# Patient Record
Sex: Male | Born: 1965 | Race: White | Hispanic: No | Smoking: Never smoker
Health system: Southern US, Community
[De-identification: ages and names within clinical notes are randomized; demographics above are authoritative.]

## PROBLEM LIST (undated history)

## (undated) DIAGNOSIS — U071 COVID-19: Secondary | ICD-10-CM

## (undated) DIAGNOSIS — E559 Vitamin D deficiency, unspecified: Secondary | ICD-10-CM

## (undated) DIAGNOSIS — R0789 Other chest pain: Secondary | ICD-10-CM

## (undated) DIAGNOSIS — S4990XA Unspecified injury of shoulder and upper arm, unspecified arm, initial encounter: Secondary | ICD-10-CM

## (undated) DIAGNOSIS — H269 Unspecified cataract: Secondary | ICD-10-CM

## (undated) DIAGNOSIS — H3320 Serous retinal detachment, unspecified eye: Secondary | ICD-10-CM

## (undated) HISTORY — PX: OTHER SURGICAL HISTORY: SHX169

## (undated) HISTORY — DX: Unspecified cataract: H26.9

## (undated) HISTORY — DX: Unspecified injury of shoulder and upper arm, unspecified arm, initial encounter: S49.90XA

## (undated) HISTORY — DX: Other chest pain: R07.89

## (undated) HISTORY — PX: CATARACT EXTRACTION, BILATERAL: SHX1313

## (undated) HISTORY — DX: Vitamin D deficiency, unspecified: E55.9

## (undated) HISTORY — DX: COVID-19: U07.1

## (undated) HISTORY — DX: Serous retinal detachment, unspecified eye: H33.20

## (undated) HISTORY — PX: VITRECTOMY: SHX106

## (undated) HISTORY — PX: ABDOMINAL SURGERY: SHX537

---

## 2003-11-25 ENCOUNTER — Emergency Department (HOSPITAL_COMMUNITY): Admission: EM | Admit: 2003-11-25 | Discharge: 2003-11-25 | Payer: Self-pay | Admitting: Emergency Medicine

## 2005-03-29 IMAGING — CT CT ANGIO CHEST
1 of 3 series · 19 of 28 positions shown · IV contrast (omnipaque)
Comparison: none

CLINICAL DATA: Chest pain, shortness of breath.
 CHEST CT ANGIO WITH CONTRAST
TECHNIQUE: Multidetector CT imaging of the chest was performed according to the protocol for detection of pulmonary embolism during IV bolus injection of 150 ml Omnipaque 300.  Coronal and sagittal plane reformatted images were also generated.

[Series 4: chest/pe 1.0 b10f · axial · 0.74mm/px · z∈[+1723,+1911]mm · 19 of 416 slices shown]
[im 20/416  lung]
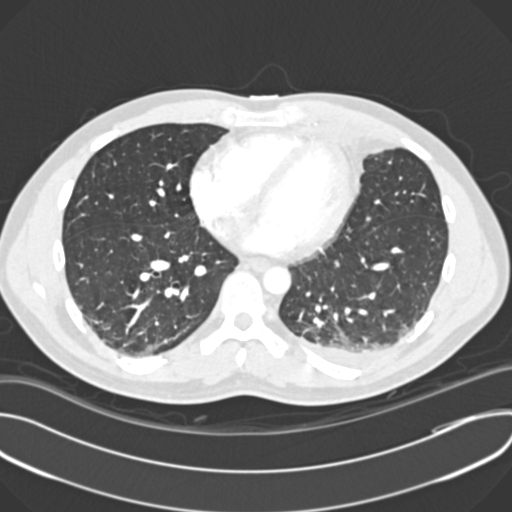
[im 40/416  mediastinal]
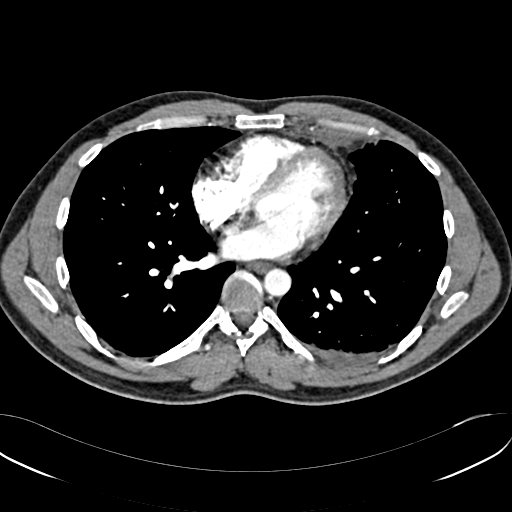
[im 60/416  lung]
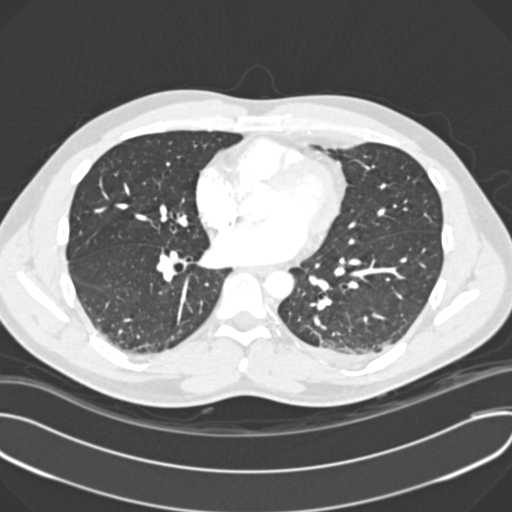
[im 80/416  mediastinal]
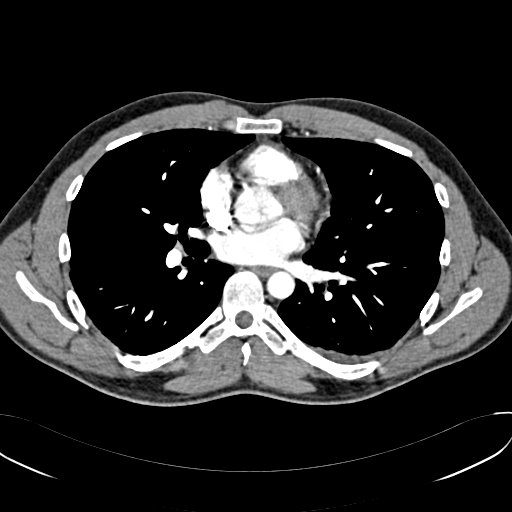
[im 99/416  lung]
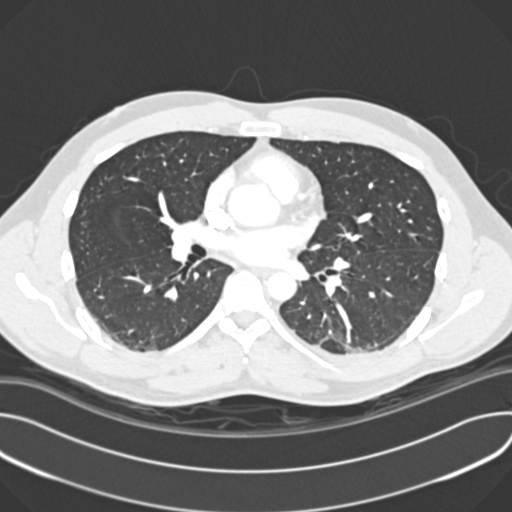
[im 119/416  mediastinal]
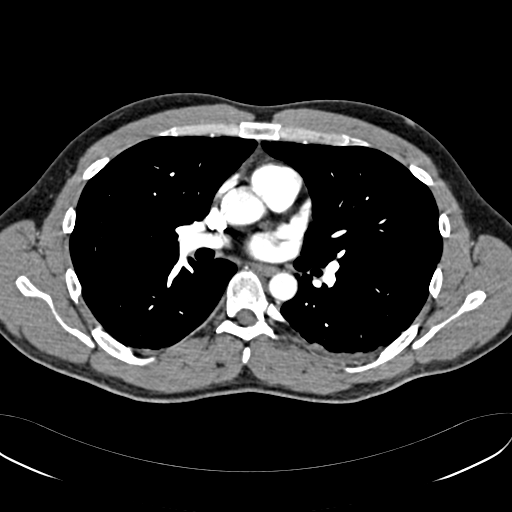
[im 139/416  lung]
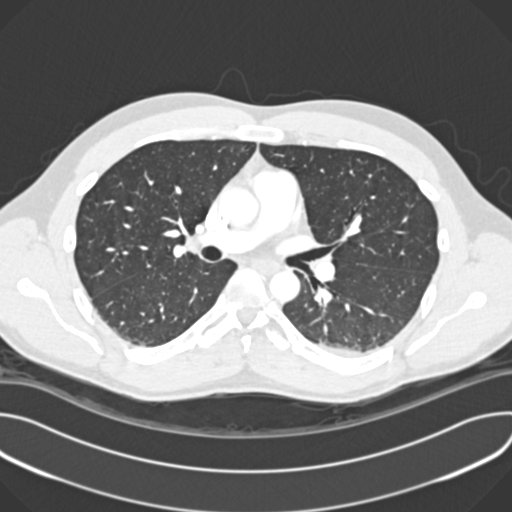
[im 159/416  mediastinal]
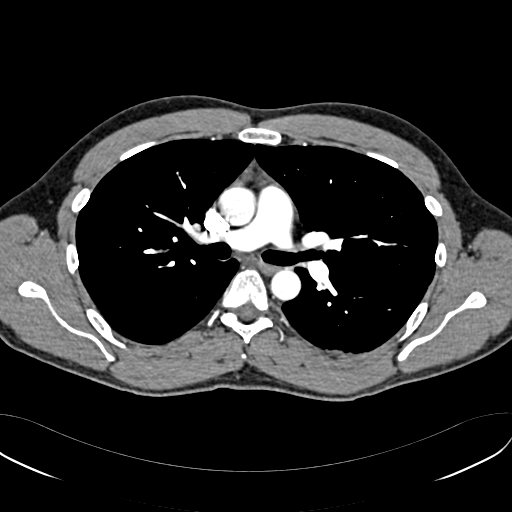
[im 178/416  lung]
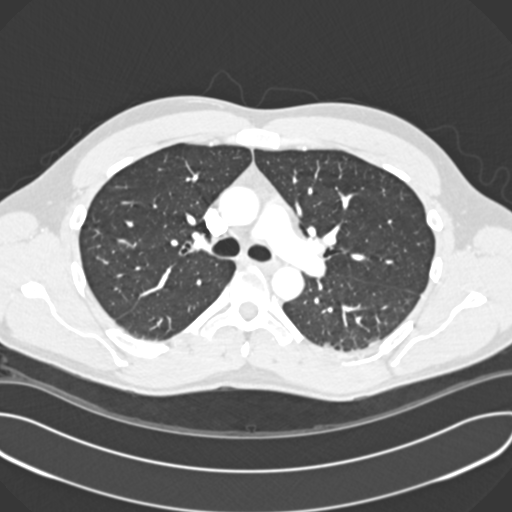
[im 218/416  mediastinal]
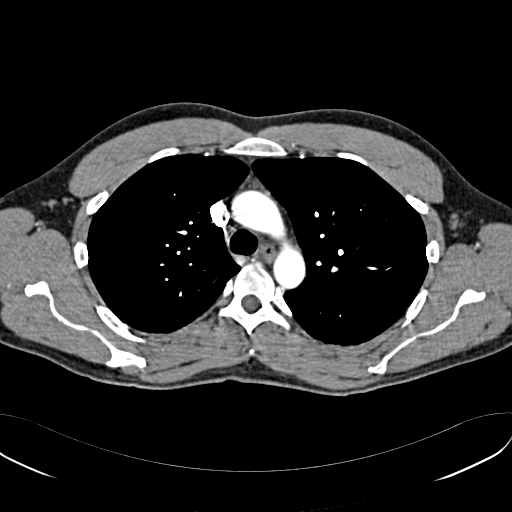
[im 238/416  lung]
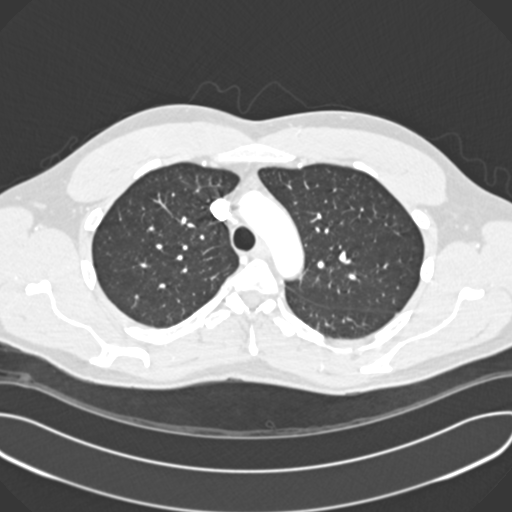
[im 257/416  mediastinal]
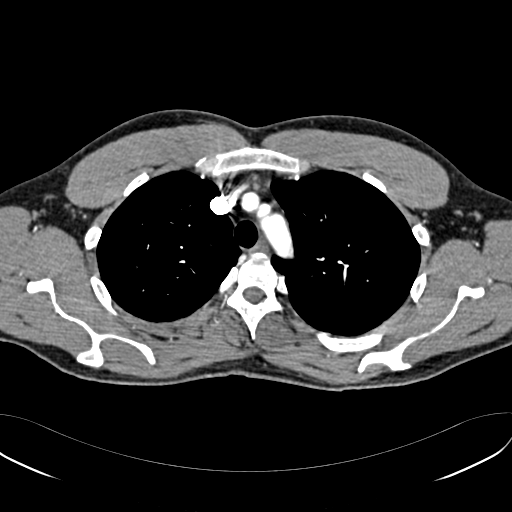
[im 277/416  lung]
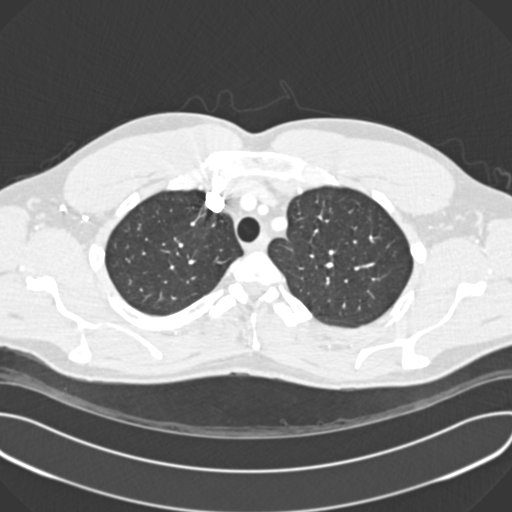
[im 297/416  mediastinal]
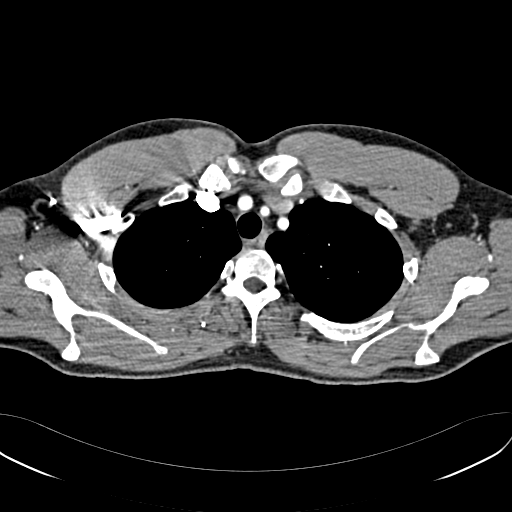
[im 317/416  lung]
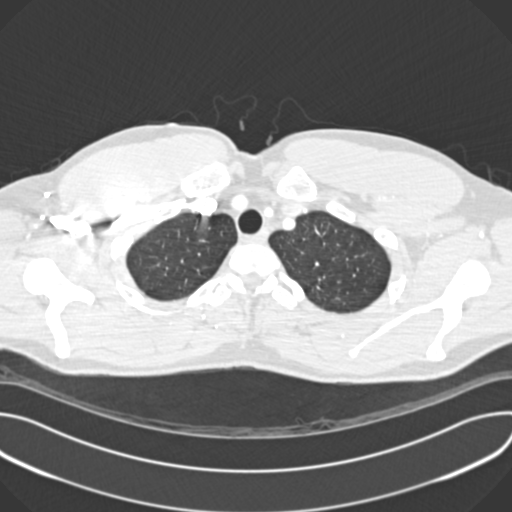
[im 336/416  mediastinal]
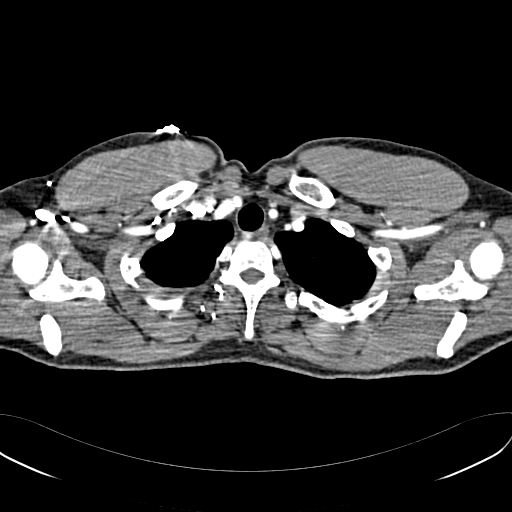
[im 356/416  lung]
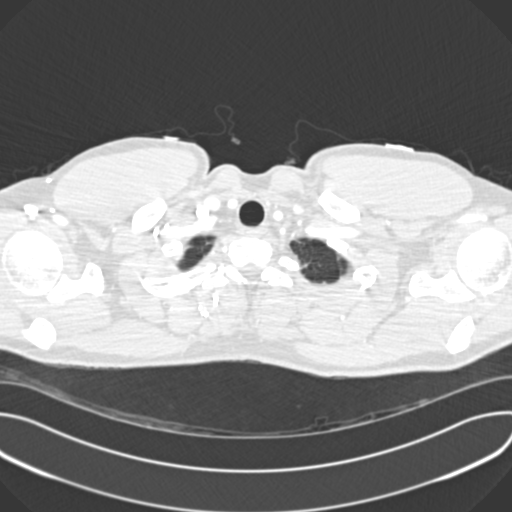
[im 376/416  mediastinal]
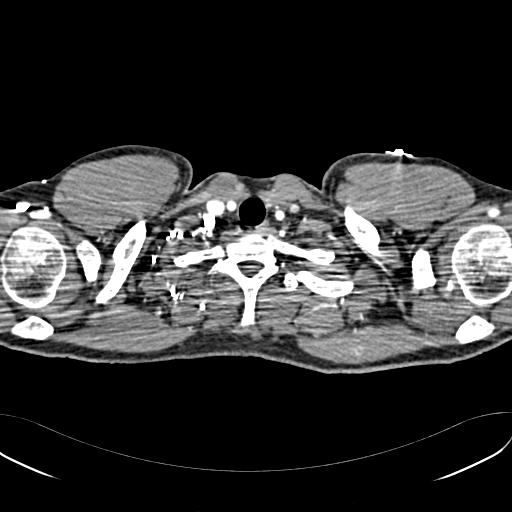
[im 396/416  lung]
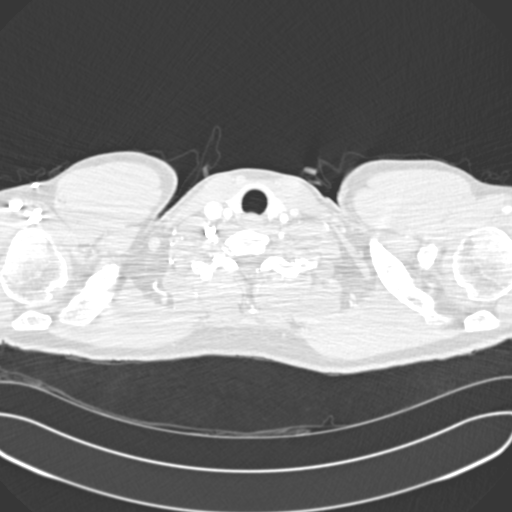

[19 of 28 positions shown; findings below may reference images not displayed]

FINDINGS: In all three planes, there are no definite filling defects in the major pulmonary arteries, or their first and second order divisions.  Despite an earlier chest x-ray showing no abnormality, there is an infiltrate at the left lung base along with a small left effusion.  The findings are compatible with pneumonia.
 No adenopathy.  No right effusion or pericardial fluid.
 IMPRESSION
 1.  No definite pulmonary emboli.
 2.  Left lower lobe air space disease with effusion.  Findings are consistent with pneumonia.

## 2015-11-07 DIAGNOSIS — R195 Other fecal abnormalities: Secondary | ICD-10-CM | POA: Diagnosis not present

## 2015-11-07 DIAGNOSIS — Z13220 Encounter for screening for lipoid disorders: Secondary | ICD-10-CM | POA: Diagnosis not present

## 2015-11-07 DIAGNOSIS — Z0000 Encounter for general adult medical examination without abnormal findings: Secondary | ICD-10-CM | POA: Diagnosis not present

## 2015-11-30 DIAGNOSIS — R197 Diarrhea, unspecified: Secondary | ICD-10-CM | POA: Diagnosis not present

## 2015-12-09 DIAGNOSIS — R197 Diarrhea, unspecified: Secondary | ICD-10-CM | POA: Diagnosis not present

## 2016-02-08 DIAGNOSIS — D1801 Hemangioma of skin and subcutaneous tissue: Secondary | ICD-10-CM | POA: Diagnosis not present

## 2016-02-08 DIAGNOSIS — L281 Prurigo nodularis: Secondary | ICD-10-CM | POA: Diagnosis not present

## 2016-02-08 DIAGNOSIS — L570 Actinic keratosis: Secondary | ICD-10-CM | POA: Diagnosis not present

## 2016-02-08 DIAGNOSIS — L720 Epidermal cyst: Secondary | ICD-10-CM | POA: Diagnosis not present

## 2016-02-08 DIAGNOSIS — Z85828 Personal history of other malignant neoplasm of skin: Secondary | ICD-10-CM | POA: Diagnosis not present

## 2016-02-08 DIAGNOSIS — L821 Other seborrheic keratosis: Secondary | ICD-10-CM | POA: Diagnosis not present

## 2016-02-13 DIAGNOSIS — R197 Diarrhea, unspecified: Secondary | ICD-10-CM | POA: Diagnosis not present

## 2016-02-13 DIAGNOSIS — D123 Benign neoplasm of transverse colon: Secondary | ICD-10-CM | POA: Diagnosis not present

## 2016-02-13 DIAGNOSIS — D126 Benign neoplasm of colon, unspecified: Secondary | ICD-10-CM | POA: Diagnosis not present

## 2016-02-13 DIAGNOSIS — K648 Other hemorrhoids: Secondary | ICD-10-CM | POA: Diagnosis not present

## 2016-02-16 DIAGNOSIS — D126 Benign neoplasm of colon, unspecified: Secondary | ICD-10-CM | POA: Diagnosis not present

## 2016-02-18 DIAGNOSIS — H524 Presbyopia: Secondary | ICD-10-CM | POA: Diagnosis not present

## 2016-11-13 DIAGNOSIS — Z0000 Encounter for general adult medical examination without abnormal findings: Secondary | ICD-10-CM | POA: Diagnosis not present

## 2017-02-12 DIAGNOSIS — D2261 Melanocytic nevi of right upper limb, including shoulder: Secondary | ICD-10-CM | POA: Diagnosis not present

## 2017-02-12 DIAGNOSIS — L570 Actinic keratosis: Secondary | ICD-10-CM | POA: Diagnosis not present

## 2017-02-12 DIAGNOSIS — D225 Melanocytic nevi of trunk: Secondary | ICD-10-CM | POA: Diagnosis not present

## 2017-02-12 DIAGNOSIS — L821 Other seborrheic keratosis: Secondary | ICD-10-CM | POA: Diagnosis not present

## 2017-05-01 DIAGNOSIS — J111 Influenza due to unidentified influenza virus with other respiratory manifestations: Secondary | ICD-10-CM | POA: Diagnosis not present

## 2017-05-01 DIAGNOSIS — R509 Fever, unspecified: Secondary | ICD-10-CM | POA: Diagnosis not present

## 2017-12-29 DIAGNOSIS — M79661 Pain in right lower leg: Secondary | ICD-10-CM | POA: Diagnosis not present

## 2017-12-29 DIAGNOSIS — Z043 Encounter for examination and observation following other accident: Secondary | ICD-10-CM | POA: Diagnosis not present

## 2017-12-29 DIAGNOSIS — I10 Essential (primary) hypertension: Secondary | ICD-10-CM | POA: Diagnosis not present

## 2017-12-29 DIAGNOSIS — S4992XA Unspecified injury of left shoulder and upper arm, initial encounter: Secondary | ICD-10-CM | POA: Diagnosis not present

## 2017-12-29 DIAGNOSIS — Z23 Encounter for immunization: Secondary | ICD-10-CM | POA: Diagnosis not present

## 2017-12-29 DIAGNOSIS — M25511 Pain in right shoulder: Secondary | ICD-10-CM | POA: Diagnosis not present

## 2017-12-29 DIAGNOSIS — Y999 Unspecified external cause status: Secondary | ICD-10-CM | POA: Diagnosis not present

## 2017-12-29 DIAGNOSIS — D72829 Elevated white blood cell count, unspecified: Secondary | ICD-10-CM | POA: Diagnosis not present

## 2017-12-29 DIAGNOSIS — S51812A Laceration without foreign body of left forearm, initial encounter: Secondary | ICD-10-CM | POA: Diagnosis not present

## 2017-12-29 DIAGNOSIS — S51802A Unspecified open wound of left forearm, initial encounter: Secondary | ICD-10-CM | POA: Diagnosis not present

## 2017-12-29 DIAGNOSIS — R52 Pain, unspecified: Secondary | ICD-10-CM | POA: Diagnosis not present

## 2017-12-29 DIAGNOSIS — S41112A Laceration without foreign body of left upper arm, initial encounter: Secondary | ICD-10-CM | POA: Diagnosis not present

## 2018-02-18 DIAGNOSIS — L821 Other seborrheic keratosis: Secondary | ICD-10-CM | POA: Diagnosis not present

## 2018-02-18 DIAGNOSIS — D225 Melanocytic nevi of trunk: Secondary | ICD-10-CM | POA: Diagnosis not present

## 2018-02-18 DIAGNOSIS — L570 Actinic keratosis: Secondary | ICD-10-CM | POA: Diagnosis not present

## 2018-02-18 DIAGNOSIS — D485 Neoplasm of uncertain behavior of skin: Secondary | ICD-10-CM | POA: Diagnosis not present

## 2018-02-18 DIAGNOSIS — L814 Other melanin hyperpigmentation: Secondary | ICD-10-CM | POA: Diagnosis not present

## 2018-03-29 DIAGNOSIS — H524 Presbyopia: Secondary | ICD-10-CM | POA: Diagnosis not present

## 2018-06-23 DIAGNOSIS — Z125 Encounter for screening for malignant neoplasm of prostate: Secondary | ICD-10-CM | POA: Diagnosis not present

## 2018-06-23 DIAGNOSIS — Z0000 Encounter for general adult medical examination without abnormal findings: Secondary | ICD-10-CM | POA: Diagnosis not present

## 2018-06-23 DIAGNOSIS — E785 Hyperlipidemia, unspecified: Secondary | ICD-10-CM | POA: Diagnosis not present

## 2018-08-22 DIAGNOSIS — R453 Demoralization and apathy: Secondary | ICD-10-CM | POA: Diagnosis not present

## 2018-08-22 DIAGNOSIS — Z79899 Other long term (current) drug therapy: Secondary | ICD-10-CM | POA: Diagnosis not present

## 2018-08-22 DIAGNOSIS — E559 Vitamin D deficiency, unspecified: Secondary | ICD-10-CM | POA: Diagnosis not present

## 2018-08-22 DIAGNOSIS — R5383 Other fatigue: Secondary | ICD-10-CM | POA: Diagnosis not present

## 2018-08-27 DIAGNOSIS — Z79899 Other long term (current) drug therapy: Secondary | ICD-10-CM | POA: Diagnosis not present

## 2018-08-27 DIAGNOSIS — R5383 Other fatigue: Secondary | ICD-10-CM | POA: Diagnosis not present

## 2018-08-27 DIAGNOSIS — E559 Vitamin D deficiency, unspecified: Secondary | ICD-10-CM | POA: Diagnosis not present

## 2018-09-20 DIAGNOSIS — H35422 Microcystoid degeneration of retina, left eye: Secondary | ICD-10-CM | POA: Diagnosis not present

## 2018-09-20 DIAGNOSIS — H43811 Vitreous degeneration, right eye: Secondary | ICD-10-CM | POA: Diagnosis not present

## 2018-09-20 DIAGNOSIS — H4311 Vitreous hemorrhage, right eye: Secondary | ICD-10-CM | POA: Diagnosis not present

## 2018-09-20 DIAGNOSIS — H33021 Retinal detachment with multiple breaks, right eye: Secondary | ICD-10-CM | POA: Diagnosis not present

## 2018-09-20 DIAGNOSIS — H3589 Other specified retinal disorders: Secondary | ICD-10-CM | POA: Diagnosis not present

## 2018-09-20 DIAGNOSIS — H33311 Horseshoe tear of retina without detachment, right eye: Secondary | ICD-10-CM | POA: Diagnosis not present

## 2018-09-22 DIAGNOSIS — H4311 Vitreous hemorrhage, right eye: Secondary | ICD-10-CM | POA: Diagnosis not present

## 2018-09-22 DIAGNOSIS — H33021 Retinal detachment with multiple breaks, right eye: Secondary | ICD-10-CM | POA: Diagnosis not present

## 2018-09-23 DIAGNOSIS — H4311 Vitreous hemorrhage, right eye: Secondary | ICD-10-CM | POA: Diagnosis not present

## 2018-09-23 DIAGNOSIS — H33021 Retinal detachment with multiple breaks, right eye: Secondary | ICD-10-CM | POA: Diagnosis not present

## 2018-10-14 DIAGNOSIS — H33021 Retinal detachment with multiple breaks, right eye: Secondary | ICD-10-CM | POA: Diagnosis not present

## 2018-10-14 DIAGNOSIS — H35422 Microcystoid degeneration of retina, left eye: Secondary | ICD-10-CM | POA: Diagnosis not present

## 2018-12-16 DIAGNOSIS — Z20828 Contact with and (suspected) exposure to other viral communicable diseases: Secondary | ICD-10-CM | POA: Diagnosis not present

## 2018-12-16 DIAGNOSIS — R197 Diarrhea, unspecified: Secondary | ICD-10-CM | POA: Diagnosis not present

## 2019-01-07 DIAGNOSIS — H524 Presbyopia: Secondary | ICD-10-CM | POA: Diagnosis not present

## 2019-03-03 DIAGNOSIS — D225 Melanocytic nevi of trunk: Secondary | ICD-10-CM | POA: Diagnosis not present

## 2019-03-03 DIAGNOSIS — L814 Other melanin hyperpigmentation: Secondary | ICD-10-CM | POA: Diagnosis not present

## 2019-03-03 DIAGNOSIS — L570 Actinic keratosis: Secondary | ICD-10-CM | POA: Diagnosis not present

## 2019-03-03 DIAGNOSIS — L821 Other seborrheic keratosis: Secondary | ICD-10-CM | POA: Diagnosis not present

## 2019-04-20 DIAGNOSIS — H2513 Age-related nuclear cataract, bilateral: Secondary | ICD-10-CM | POA: Diagnosis not present

## 2019-04-20 DIAGNOSIS — H43812 Vitreous degeneration, left eye: Secondary | ICD-10-CM | POA: Diagnosis not present

## 2019-04-20 DIAGNOSIS — H31091 Other chorioretinal scars, right eye: Secondary | ICD-10-CM | POA: Diagnosis not present

## 2019-04-20 DIAGNOSIS — H35423 Microcystoid degeneration of retina, bilateral: Secondary | ICD-10-CM | POA: Diagnosis not present

## 2019-05-19 DIAGNOSIS — H33012 Retinal detachment with single break, left eye: Secondary | ICD-10-CM | POA: Diagnosis not present

## 2019-05-19 DIAGNOSIS — H43812 Vitreous degeneration, left eye: Secondary | ICD-10-CM | POA: Diagnosis not present

## 2019-05-19 DIAGNOSIS — H35421 Microcystoid degeneration of retina, right eye: Secondary | ICD-10-CM | POA: Diagnosis not present

## 2019-05-19 DIAGNOSIS — H3562 Retinal hemorrhage, left eye: Secondary | ICD-10-CM | POA: Diagnosis not present

## 2019-05-26 DIAGNOSIS — H33012 Retinal detachment with single break, left eye: Secondary | ICD-10-CM | POA: Diagnosis not present

## 2019-05-26 DIAGNOSIS — H33021 Retinal detachment with multiple breaks, right eye: Secondary | ICD-10-CM | POA: Diagnosis not present

## 2019-05-29 DIAGNOSIS — H2513 Age-related nuclear cataract, bilateral: Secondary | ICD-10-CM | POA: Diagnosis not present

## 2019-05-29 DIAGNOSIS — H18413 Arcus senilis, bilateral: Secondary | ICD-10-CM | POA: Diagnosis not present

## 2019-05-29 DIAGNOSIS — H2511 Age-related nuclear cataract, right eye: Secondary | ICD-10-CM | POA: Diagnosis not present

## 2019-05-29 DIAGNOSIS — H25043 Posterior subcapsular polar age-related cataract, bilateral: Secondary | ICD-10-CM | POA: Diagnosis not present

## 2019-05-29 DIAGNOSIS — H25013 Cortical age-related cataract, bilateral: Secondary | ICD-10-CM | POA: Diagnosis not present

## 2019-06-01 DIAGNOSIS — H2511 Age-related nuclear cataract, right eye: Secondary | ICD-10-CM | POA: Diagnosis not present

## 2019-06-02 DIAGNOSIS — H2512 Age-related nuclear cataract, left eye: Secondary | ICD-10-CM | POA: Diagnosis not present

## 2019-06-09 DIAGNOSIS — H2513 Age-related nuclear cataract, bilateral: Secondary | ICD-10-CM | POA: Diagnosis not present

## 2019-06-22 DIAGNOSIS — H2512 Age-related nuclear cataract, left eye: Secondary | ICD-10-CM | POA: Diagnosis not present

## 2019-06-30 DIAGNOSIS — H2513 Age-related nuclear cataract, bilateral: Secondary | ICD-10-CM | POA: Diagnosis not present

## 2019-07-13 DIAGNOSIS — Z0000 Encounter for general adult medical examination without abnormal findings: Secondary | ICD-10-CM | POA: Diagnosis not present

## 2019-07-13 DIAGNOSIS — Z125 Encounter for screening for malignant neoplasm of prostate: Secondary | ICD-10-CM | POA: Diagnosis not present

## 2019-07-13 DIAGNOSIS — Z13220 Encounter for screening for lipoid disorders: Secondary | ICD-10-CM | POA: Diagnosis not present

## 2019-07-13 DIAGNOSIS — E559 Vitamin D deficiency, unspecified: Secondary | ICD-10-CM | POA: Diagnosis not present

## 2019-07-21 DIAGNOSIS — H35371 Puckering of macula, right eye: Secondary | ICD-10-CM | POA: Diagnosis not present

## 2019-07-21 DIAGNOSIS — H31093 Other chorioretinal scars, bilateral: Secondary | ICD-10-CM | POA: Diagnosis not present

## 2019-07-21 DIAGNOSIS — H43812 Vitreous degeneration, left eye: Secondary | ICD-10-CM | POA: Diagnosis not present

## 2019-07-31 ENCOUNTER — Ambulatory Visit (INDEPENDENT_AMBULATORY_CARE_PROVIDER_SITE_OTHER): Payer: BC Managed Care – PPO

## 2019-07-31 ENCOUNTER — Ambulatory Visit: Payer: BC Managed Care – PPO | Admitting: Podiatry

## 2019-07-31 ENCOUNTER — Other Ambulatory Visit: Payer: Self-pay

## 2019-07-31 ENCOUNTER — Encounter: Payer: Self-pay | Admitting: Podiatry

## 2019-07-31 ENCOUNTER — Other Ambulatory Visit: Payer: Self-pay | Admitting: Podiatry

## 2019-07-31 ENCOUNTER — Other Ambulatory Visit: Payer: BC Managed Care – PPO | Admitting: Orthotics

## 2019-07-31 DIAGNOSIS — M79672 Pain in left foot: Secondary | ICD-10-CM

## 2019-07-31 DIAGNOSIS — M79671 Pain in right foot: Secondary | ICD-10-CM

## 2019-07-31 DIAGNOSIS — M2142 Flat foot [pes planus] (acquired), left foot: Secondary | ICD-10-CM

## 2019-07-31 DIAGNOSIS — M2141 Flat foot [pes planus] (acquired), right foot: Secondary | ICD-10-CM

## 2019-07-31 DIAGNOSIS — Q666 Other congenital valgus deformities of feet: Secondary | ICD-10-CM | POA: Diagnosis not present

## 2019-07-31 NOTE — Progress Notes (Signed)
Subjective:  Patient ID: Matthew Koch, male    DOB: Feb 26, 1966,  MRN: [id number]  Chief Complaint  Patient presents with  . Foot Pain    pt is here not really for pain, but states that the foot is not really painful, but is looking to get orthotics changed    54 y.o. male presents with the above complaint.  Patient presents with complaint of arch pain mildly and would like to discuss orthotics management.  Patient has had orthotics most of his life and would like to obtain a new pair.  Patient states his has been in orthotics for a long period of time.  They tend to be very comfortable and they are very supportive.  His last pair of orthotics was 54 years old and would like to get a new pair.  He denies any other acute complaints.  He would like to be casted today.    Review of Systems: Negative except as noted in the HPI. Denies N/V/F/Ch.  No past medical history on file.  Current Outpatient Medications:  .  BESIVANCE 0.6 % SUSP, Place 1 drop into the left eye 3 (three) times daily., Disp: , Rfl:   Social History   Tobacco Use  Smoking Status Not on file    Allergies  Allergen Reactions  . Penicillins Other (See Comments)    Other Reaction: muscle cramping   Objective:  There were no vitals filed for this visit. There is no height or weight on file to calculate BMI. Constitutional Well developed. Well nourished.  Vascular Dorsalis pedis pulses palpable bilaterally. Posterior tibial pulses palpable bilaterally. Capillary refill normal to all digits.  No cyanosis or clubbing noted. Pedal hair growth normal.  Neurologic Normal speech. Oriented to person, place, and time. Epicritic sensation to light touch grossly present bilaterally.  Dermatologic Nails well groomed and normal in appearance. No open wounds. No skin lesions.  Orthopedic:  No pain on palpation however upon gait.  Weightbearing examination there is pes planus deformity with too many toe signs noted.   Partially able to recreate the arch with dorsiflexion of the hallux.  Patient is able to perform single and double heel raise.  The calcaneal stance position returns to rectus when performing single and double heel raises.   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  The hardware is intact.  No signs of loosening or backing out noted.  No arthritic changes noted to the right side.  No bony abnormality noted to the right side.  There is anterior break in the cyma line with increase in talar declination angle.  Mild elevatus present bilaterally.  Assessment:   1. Foot pain, bilateral   2. Pes planovalgus    Plan:  Patient was evaluated and treated and all questions answered.  Semiflexible pes planus deformity noted -I explained to the patient the etiology of pes planus deformity and various treatment options were discussed.  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  He is already had previous orthotics made and has worn them out.  He would like to discuss getting new pair of orthotics.  The casting for orthotics were taken today. -Patient will be scheduled to see [facility] for custom-made orthotics and pickup.  Return for See Liliane Channel for orthotics ASAP.

## 2019-08-25 ENCOUNTER — Ambulatory Visit: Payer: BC Managed Care – PPO | Admitting: Orthotics

## 2019-08-25 ENCOUNTER — Other Ambulatory Visit: Payer: Self-pay

## 2019-08-25 DIAGNOSIS — M79671 Pain in right foot: Secondary | ICD-10-CM

## 2019-08-25 DIAGNOSIS — M79672 Pain in left foot: Secondary | ICD-10-CM

## 2019-08-25 DIAGNOSIS — Q666 Other congenital valgus deformities of feet: Secondary | ICD-10-CM

## 2019-08-31 NOTE — Progress Notes (Signed)
Patient came in today to p/up functional foot orthotics.   The orthotics were assessed to both fit and function.  The F/O addressed the biomechanical issues/pathologies as intended, offering good longitudinal arch support, proper offloading, and foot support. There weren't any signs of discomfort or irritation.  The F/O fit properly in footwear with minimal trimming/adjustments. 

## 2019-09-25 ENCOUNTER — Telehealth: Payer: Self-pay | Admitting: Podiatry

## 2019-09-25 NOTE — Telephone Encounter (Signed)
Pt left message checking on status of refurbished orthotics that were dropped off at the beginning of may.  Returned call and left message for pt that orthotics are in and ok to pick up.Marland KitchenMarland Kitchen

## 2019-10-20 DIAGNOSIS — Q666 Other congenital valgus deformities of feet: Secondary | ICD-10-CM

## 2019-10-20 DIAGNOSIS — H26492 Other secondary cataract, left eye: Secondary | ICD-10-CM | POA: Diagnosis not present

## 2019-10-20 DIAGNOSIS — H33022 Retinal detachment with multiple breaks, left eye: Secondary | ICD-10-CM | POA: Diagnosis not present

## 2019-10-20 DIAGNOSIS — H35373 Puckering of macula, bilateral: Secondary | ICD-10-CM | POA: Diagnosis not present

## 2019-10-20 DIAGNOSIS — H31093 Other chorioretinal scars, bilateral: Secondary | ICD-10-CM | POA: Diagnosis not present

## 2019-12-03 DIAGNOSIS — Z48810 Encounter for surgical aftercare following surgery on the sense organs: Secondary | ICD-10-CM | POA: Diagnosis not present

## 2019-12-03 DIAGNOSIS — H3581 Retinal edema: Secondary | ICD-10-CM | POA: Diagnosis not present

## 2019-12-03 DIAGNOSIS — H35372 Puckering of macula, left eye: Secondary | ICD-10-CM | POA: Diagnosis not present

## 2019-12-03 DIAGNOSIS — H26492 Other secondary cataract, left eye: Secondary | ICD-10-CM | POA: Diagnosis not present

## 2019-12-03 DIAGNOSIS — H33022 Retinal detachment with multiple breaks, left eye: Secondary | ICD-10-CM | POA: Diagnosis not present

## 2019-12-04 DIAGNOSIS — H33022 Retinal detachment with multiple breaks, left eye: Secondary | ICD-10-CM | POA: Diagnosis not present

## 2019-12-15 DIAGNOSIS — H35372 Puckering of macula, left eye: Secondary | ICD-10-CM | POA: Diagnosis not present

## 2019-12-15 DIAGNOSIS — H33022 Retinal detachment with multiple breaks, left eye: Secondary | ICD-10-CM | POA: Diagnosis not present

## 2020-01-05 DIAGNOSIS — H33022 Retinal detachment with multiple breaks, left eye: Secondary | ICD-10-CM | POA: Diagnosis not present

## 2020-01-05 DIAGNOSIS — H35372 Puckering of macula, left eye: Secondary | ICD-10-CM | POA: Diagnosis not present

## 2020-02-02 DIAGNOSIS — H59813 Chorioretinal scars after surgery for detachment, bilateral: Secondary | ICD-10-CM | POA: Diagnosis not present

## 2020-02-02 DIAGNOSIS — H3581 Retinal edema: Secondary | ICD-10-CM | POA: Diagnosis not present

## 2020-03-07 DIAGNOSIS — L814 Other melanin hyperpigmentation: Secondary | ICD-10-CM | POA: Diagnosis not present

## 2020-03-07 DIAGNOSIS — L570 Actinic keratosis: Secondary | ICD-10-CM | POA: Diagnosis not present

## 2020-03-07 DIAGNOSIS — D225 Melanocytic nevi of trunk: Secondary | ICD-10-CM | POA: Diagnosis not present

## 2020-03-07 DIAGNOSIS — L821 Other seborrheic keratosis: Secondary | ICD-10-CM | POA: Diagnosis not present

## 2020-04-01 DIAGNOSIS — H3581 Retinal edema: Secondary | ICD-10-CM | POA: Diagnosis not present

## 2020-04-01 DIAGNOSIS — H35371 Puckering of macula, right eye: Secondary | ICD-10-CM | POA: Diagnosis not present

## 2020-04-01 DIAGNOSIS — H31093 Other chorioretinal scars, bilateral: Secondary | ICD-10-CM | POA: Diagnosis not present

## 2020-05-07 DIAGNOSIS — H2513 Age-related nuclear cataract, bilateral: Secondary | ICD-10-CM | POA: Diagnosis not present

## 2020-07-18 DIAGNOSIS — Z125 Encounter for screening for malignant neoplasm of prostate: Secondary | ICD-10-CM | POA: Diagnosis not present

## 2020-07-18 DIAGNOSIS — Z0000 Encounter for general adult medical examination without abnormal findings: Secondary | ICD-10-CM | POA: Diagnosis not present

## 2020-07-18 DIAGNOSIS — E559 Vitamin D deficiency, unspecified: Secondary | ICD-10-CM | POA: Diagnosis not present

## 2020-07-18 DIAGNOSIS — R0789 Other chest pain: Secondary | ICD-10-CM | POA: Diagnosis not present

## 2020-07-18 DIAGNOSIS — Z13220 Encounter for screening for lipoid disorders: Secondary | ICD-10-CM | POA: Diagnosis not present

## 2020-07-25 ENCOUNTER — Telehealth: Payer: Self-pay

## 2020-07-25 NOTE — Telephone Encounter (Signed)
Referral notes sent from [facility], Phone #: [phone], Fax #: [phone]   Notes sent to scheduling 

## 2020-09-15 ENCOUNTER — Ambulatory Visit: Payer: Self-pay | Admitting: Cardiology

## 2020-09-30 DIAGNOSIS — H3581 Retinal edema: Secondary | ICD-10-CM | POA: Diagnosis not present

## 2020-09-30 DIAGNOSIS — H59813 Chorioretinal scars after surgery for detachment, bilateral: Secondary | ICD-10-CM | POA: Diagnosis not present

## 2020-09-30 DIAGNOSIS — H35373 Puckering of macula, bilateral: Secondary | ICD-10-CM | POA: Diagnosis not present

## 2020-10-27 ENCOUNTER — Ambulatory Visit: Payer: Self-pay | Admitting: Cardiology

## 2020-10-30 DIAGNOSIS — R509 Fever, unspecified: Secondary | ICD-10-CM | POA: Diagnosis not present

## 2020-10-30 DIAGNOSIS — Z20822 Contact with and (suspected) exposure to covid-19: Secondary | ICD-10-CM | POA: Diagnosis not present

## 2020-10-30 DIAGNOSIS — R519 Headache, unspecified: Secondary | ICD-10-CM | POA: Diagnosis not present

## 2020-10-30 DIAGNOSIS — R197 Diarrhea, unspecified: Secondary | ICD-10-CM | POA: Diagnosis not present

## 2021-02-20 NOTE — Progress Notes (Signed)
Cardiology Office Note:    Date:  03/06/2021   ID:  Matthew Koch, DOB 08/12/1965, MRN [id number]  PCP:  Darrow Bussing, MD   [facility] Providers Cardiologist:  None {  Referring MD: Darrow Bussing, MD    History of Present Illness:    Matthew Koch is a 55 y.o. male who is overall healthy with no significant cardiac medical history who was referred by Dr. Docia Chuck for further evaluation of chest pressure.   Today, the patient states that about a year ago he was having episodes of chest pressure that occurred during time of stress at work. The symptoms were described as a substernal pressure that radiated down his arm. Symptoms improved with calming down. Symptoms have continued to occur since that time although he lost 12lbs which has helped. Also notes exertional fatigue and some dyspnea but no chest pain with activity.  He is able to work in the yard at a slower pace for 4-5 hours. No formal exercise. Denies any palpitations, LE edema, orthopnea or PND. Snores at night but no daytime fatigue. Currently not taking any medications.  Family history notable for uncles with MI in 70s/50s, Father passed away in 72s due to cancer.   Past Medical History:  Diagnosis Date   Arm injury    Cataract    Chest pressure    COVID    Retinal detachment    Vitamin D deficiency     Past Surgical History:  Procedure Laterality Date   ABDOMINAL SURGERY     FLAP   CATARACT EXTRACTION, BILATERAL     RECONSTRUCTIVE FOOT     VITRECTOMY      Current Medications: Current Meds  Medication Sig   Cholecalciferol (VITAMIN D) 50 MCG (2000 UT) CAPS Take by mouth.   ibuprofen (ADVIL) 200 MG tablet Take 200 mg by mouth every 6 (six) hours as needed.   Multiple Vitamins-Minerals (MULTIVITAMIN ADULTS 50+ PO) Take by mouth.     Allergies:   Penicillins   Social History   Socioeconomic History   Marital status: Married    Spouse name: Not on file   Number of children: Not on file   Years of  education: Not on file   Highest education level: Not on file  Occupational History   Not on file  Tobacco Use   Smoking status: Never   Smokeless tobacco: Never  Substance and Sexual Activity   Alcohol use: Yes   Drug use: Never   Sexual activity: Not on file  Other Topics Concern   Not on file  Social History Narrative   Not on file   Social Determinants of Health   Financial Resource Strain: Not on file  Food Insecurity: Not on file  Transportation Needs: Not on file  Physical Activity: Not on file  Stress: Not on file  Social Connections: Not on file     Family History: The patient's family history includes Breast cancer in his mother; Esophageal cancer in his father; Healthy in his daughter and sister; Heart failure in his paternal grandmother; Other in his father.  ROS:   Please see the history of present illness.    Review of Systems  Constitutional:  Negative for chills and fever.  HENT:  Negative for hearing loss.   Eyes:  Negative for blurred vision.  Respiratory:  Positive for shortness of breath.   Cardiovascular:  Positive for chest pain. Negative for palpitations, orthopnea, claudication, leg swelling and PND.  Gastrointestinal:  Negative  for nausea and vomiting.  Genitourinary:  Negative for urgency.  Musculoskeletal:  Negative for falls.  Neurological:  Negative for dizziness and loss of consciousness.  Psychiatric/Behavioral:  Negative for depression.     EKGs/Labs/Other Studies Reviewed:    The following studies were reviewed today: No cardiac studies  EKG:  EKG is  ordered today.  The ekg ordered today demonstrates NSR with HR 60  Recent Labs: No results found for requested labs within last 8760 hours.  Recent Lipid Panel No results found for: CHOL, TRIG, HDL, CHOLHDL, VLDL, LDLCALC, LDLDIRECT   Risk Assessment/Calculations:           Physical Exam:    VS:  BP 128/74   Pulse 60   Ht 5\' 11"  (1.803 m)   Wt 204 lb 9.6 oz (92.8 kg)    SpO2 98%   BMI 28.54 kg/m     Wt Readings from Last 3 Encounters:  03/06/21 204 lb 9.6 oz (92.8 kg)     GEN:  Well nourished, well developed in no acute distress HEENT: Normal NECK: No JVD; No carotid bruits CARDIAC: RRR, no murmurs, rubs, gallops RESPIRATORY:  Clear to auscultation without rales, wheezing or rhonchi  ABDOMEN: Soft, non-tender, non-distended MUSCULOSKELETAL:  No edema; No deformity  SKIN: Warm and dry NEUROLOGIC:  Alert and oriented x 3 PSYCHIATRIC:  Normal affect   ASSESSMENT:    1. Precordial pain   2. Pure hypercholesterolemia    PLAN:    In order of problems listed above:  #Chest Pressure: Patient with episodes of chest pressure radiating down his left arm during period of stress that are relieved with calming down. No chest pressure with exertion, however, noticed that over the past year he has been "slowing down" and cannot keep the pace he is used to. Family history notable for 2 uncles with myocardial infarctions in their 37-50s. Given persistence of symptoms and family history, will check coronary CTA. -Check Coronary CTA to assess for CAD -Pending CTA, will determine if needs statin/ASA -Continue lifestyle modifications including diet and exercise as detailed above  #HLD: LDL 113. HDL 38 in 07/2020.  -Will determine if merits lipid lowering therapy pending CTA -Discussed diet and exercise as detailed below  Exercise recommendations: Goal of exercising for at least 30 minutes a day, at least 5 times per week.  Please exercise to a moderate exertion.  This means that while exercising it is difficult to speak in full sentences, however you are not so short of breath that you feel you must stop, and not so comfortable that you can carry on a full conversation.  Exertion level should be approximately a 5/10, if 10 is the most exertion you can perform.  Diet recommendations: Recommend a heart healthy diet such as the Mediterranean diet.  This diet  consists of plant based foods, healthy fats, lean meats, olive oil.  It suggests limiting the intake of simple carbohydrates such as white breads, pastries, and pastas.  It also limits the amount of red meat, wine, and dairy products such as cheese that one should consume on a daily basis.         Medication Adjustments/Labs and Tests Ordered: Current medicines are reviewed at length with the patient today.  Concerns regarding medicines are outlined above.  Orders Placed This Encounter  Procedures   CT CORONARY MORPH W/CTA COR W/SCORE W/CA W/CM &/OR WO/CM   Basic metabolic panel   EKG 12-Lead   No orders of the defined types were  placed in this encounter.   Patient Instructions  Medication Instructions:   Your physician recommends that you continue on your current medications as directed. Please refer to the Current Medication list given to you today.  *If you need a refill on your cardiac medications before your next appointment, please call your pharmacy*   Lab Work:  TODAY--BMET  If you have labs (blood work) drawn today and your tests are completely normal, you will receive your results only by: MyChart Message (if you have MyChart) OR A paper copy in the mail If you have any lab test that is abnormal or we need to change your treatment, we will call you to review the results.   Testing/Procedures:    Your cardiac CT will be scheduled at one of the below locations:   [facility] [address] [place], [place] [address] [phone]   If scheduled at [facility], please arrive at the [facility] main entrance (entrance A) of [facility] 30 minutes prior to test start time. You can use the FREE valet parking offered at the main entrance (encouraged to control the heart rate for the test) Proceed to the [facility] Radiology Department (first floor) to check-in and test prep.  Please follow these instructions carefully (unless otherwise  directed):  On the Night Before the Test: Be sure to Drink plenty of water. Do not consume any caffeinated/decaffeinated beverages or chocolate 12 hours prior to your test. Do not take any antihistamines 12 hours prior to your test.  On the Day of the Test: Drink plenty of water until 1 hour prior to the test. Do not eat any food 4 hours prior to the test. You may take your regular medications prior to the test.   After the Test: Drink plenty of water. After receiving IV contrast, you may experience a mild flushed feeling. This is normal. On occasion, you may experience a mild rash up to 24 hours after the test. This is not dangerous. If this occurs, you can take Benadryl 25 mg and increase your fluid intake. If you experience trouble breathing, this can be serious. If it is severe call 911 IMMEDIATELY. If it is mild, please call our office.  Please allow 2-4 weeks for scheduling of routine cardiac CTs. Some insurance companies require a pre-authorization which may delay scheduling of this test.   For non-scheduling related questions, please contact the cardiac imaging nurse navigator should you have any questions/concerns: Rockwell Alexandria, Cardiac Imaging Nurse Navigator Larey Brick, Cardiac Imaging Nurse Navigator [facility] Direct Office Dial: [phone]   For scheduling needs, including cancellations and rescheduling, please call [place], [phone].    Follow-Up:  WILL BE BASED ON THE RESULTS OF CARDIAC CT     Signed, Meriam Sprague, MD  03/06/2021 8:43 AM    [facility]

## 2021-03-06 ENCOUNTER — Encounter: Payer: Self-pay | Admitting: Cardiology

## 2021-03-06 ENCOUNTER — Telehealth: Payer: Self-pay | Admitting: *Deleted

## 2021-03-06 ENCOUNTER — Other Ambulatory Visit: Payer: Self-pay

## 2021-03-06 ENCOUNTER — Ambulatory Visit: Payer: BC Managed Care – PPO | Admitting: Cardiology

## 2021-03-06 VITALS — BP 128/74 | HR 60 | Ht 71.0 in | Wt 204.6 lb

## 2021-03-06 DIAGNOSIS — R072 Precordial pain: Secondary | ICD-10-CM | POA: Diagnosis not present

## 2021-03-06 DIAGNOSIS — E7800 Pure hypercholesterolemia, unspecified: Secondary | ICD-10-CM | POA: Diagnosis not present

## 2021-03-06 NOTE — Addendum Note (Signed)
Addended by: Laurance Flatten on: 03/06/2021 10:34 AM   Modules accepted: Level of Service

## 2021-03-06 NOTE — Telephone Encounter (Signed)
-----   Message from Lennie Odor, RN sent at 03/06/2021  3:33 PM EST ----- Regarding: RE: CARDIAC CT PER [place] Appt 03/20/21 at 7:45a  ----- Message ----- From: Loa Socks, LPN Sent: 32/05/3341   8:36 AM EST To: Lennie Odor, RN, Dorette Grate, RN, # Subject: CARDIAC CT PER [place]                       Dr. Shari Prows saw this pt in clinic today.  She ordered a Cardiac CT for CP.  Order is in and went ahead and got his BMET.  Please pre-cert and schedule then shoot me the date.  Thanks for all you do, Lajoyce Corners

## 2021-03-06 NOTE — Patient Instructions (Signed)
Medication Instructions:   Your physician recommends that you continue on your current medications as directed. Please refer to the Current Medication list given to you today.  *If you need a refill on your cardiac medications before your next appointment, please call your pharmacy*   Lab Work:  TODAY--BMET  If you have labs (blood work) drawn today and your tests are completely normal, you will receive your results only by: MyChart Message (if you have MyChart) OR A paper copy in the mail If you have any lab test that is abnormal or we need to change your treatment, we will call you to review the results.   Testing/Procedures:    Your cardiac CT will be scheduled at one of the below locations:   [facility] [address] [place], [place] [address] [phone]   If scheduled at [facility], please arrive at the [facility] main entrance (entrance A) of [facility] 30 minutes prior to test start time. You can use the FREE valet parking offered at the main entrance (encouraged to control the heart rate for the test) Proceed to the [facility] Radiology Department (first floor) to check-in and test prep.  Please follow these instructions carefully (unless otherwise directed):  On the Night Before the Test: Be sure to Drink plenty of water. Do not consume any caffeinated/decaffeinated beverages or chocolate 12 hours prior to your test. Do not take any antihistamines 12 hours prior to your test.  On the Day of the Test: Drink plenty of water until 1 hour prior to the test. Do not eat any food 4 hours prior to the test. You may take your regular medications prior to the test.   After the Test: Drink plenty of water. After receiving IV contrast, you may experience a mild flushed feeling. This is normal. On occasion, you may experience a mild rash up to 24 hours after the test. This is not dangerous. If this occurs, you can take Benadryl 25 mg  and increase your fluid intake. If you experience trouble breathing, this can be serious. If it is severe call 911 IMMEDIATELY. If it is mild, please call our office.  Please allow 2-4 weeks for scheduling of routine cardiac CTs. Some insurance companies require a pre-authorization which may delay scheduling of this test.   For non-scheduling related questions, please contact the cardiac imaging nurse navigator should you have any questions/concerns: Rockwell Alexandria, Cardiac Imaging Nurse Navigator Larey Brick, Cardiac Imaging Nurse Navigator [place] [facility] Direct Office Dial: [phone]   For scheduling needs, including cancellations and rescheduling, please call [place], [phone].    Follow-Up:  WILL BE BASED ON THE RESULTS OF CARDIAC CT

## 2021-03-07 LAB — BASIC METABOLIC PANEL
BUN/Creatinine Ratio: 17 (ref 9–20)
BUN: 19 mg/dL (ref 6–24)
CO2: 22 mmol/L (ref 20–29)
Calcium: 9.2 mg/dL (ref 8.7–10.2)
Chloride: 107 mmol/L — ABNORMAL HIGH (ref 96–106)
Creatinine, Ser: 1.15 mg/dL (ref 0.76–1.27)
Glucose: 82 mg/dL (ref 70–99)
Potassium: 4.2 mmol/L (ref 3.5–5.2)
Sodium: 141 mmol/L (ref 134–144)
eGFR: 75 mL/min/{1.73_m2} (ref >59)

## 2021-03-13 DIAGNOSIS — D225 Melanocytic nevi of trunk: Secondary | ICD-10-CM | POA: Diagnosis not present

## 2021-03-13 DIAGNOSIS — L739 Follicular disorder, unspecified: Secondary | ICD-10-CM | POA: Diagnosis not present

## 2021-03-13 DIAGNOSIS — L738 Other specified follicular disorders: Secondary | ICD-10-CM | POA: Diagnosis not present

## 2021-03-13 DIAGNOSIS — L821 Other seborrheic keratosis: Secondary | ICD-10-CM | POA: Diagnosis not present

## 2021-03-13 DIAGNOSIS — D485 Neoplasm of uncertain behavior of skin: Secondary | ICD-10-CM | POA: Diagnosis not present

## 2021-03-13 DIAGNOSIS — L720 Epidermal cyst: Secondary | ICD-10-CM | POA: Diagnosis not present

## 2021-03-13 DIAGNOSIS — L570 Actinic keratosis: Secondary | ICD-10-CM | POA: Diagnosis not present

## 2021-03-16 ENCOUNTER — Telehealth (HOSPITAL_COMMUNITY): Payer: Self-pay | Admitting: *Deleted

## 2021-03-16 NOTE — Telephone Encounter (Signed)
Reaching out to patient to offer assistance regarding upcoming cardiac imaging study; pt verbalizes understanding of appt date/time, parking situation and where to check in, pre-test NPO status, and verified current allergies; name and call back number provided for further questions should they arise  Matthew Brick RN Navigator Cardiac Imaging [facility] [phone] office [phone] cell  Patient aware to arrive no later than 7:30am for his 7:45am scan.

## 2021-03-20 ENCOUNTER — Other Ambulatory Visit: Payer: Self-pay

## 2021-03-20 ENCOUNTER — Encounter (HOSPITAL_COMMUNITY): Payer: Self-pay

## 2021-03-20 ENCOUNTER — Ambulatory Visit (HOSPITAL_COMMUNITY)
Admission: RE | Admit: 2021-03-20 | Discharge: 2021-03-20 | Disposition: A | Discharge status: Home / Self Care | Payer: BC Managed Care – PPO | Source: Ambulatory Visit | Attending: Cardiology | Admitting: Cardiology

## 2021-03-20 DIAGNOSIS — R072 Precordial pain: Secondary | ICD-10-CM | POA: Insufficient documentation

## 2021-03-20 MED ORDER — DILTIAZEM HCL 25 MG/5ML IV SOLN: 5.0000 mg | INTRAVENOUS | Status: DC | PRN

## 2021-03-20 MED ORDER — NITROGLYCERIN 0.4 MG SL SUBL
SUBLINGUAL_TABLET | SUBLINGUAL | Status: AC
  Filled 2021-03-20: qty 2

## 2021-03-20 MED ORDER — METOPROLOL TARTRATE 5 MG/5ML IV SOLN: 5.0000 mg | INTRAVENOUS | Status: DC | PRN

## 2021-03-20 MED ORDER — NITROGLYCERIN 0.4 MG SL SUBL
0.8000 mg | SUBLINGUAL_TABLET | Freq: Once | SUBLINGUAL | Status: AC
  Administered 2021-03-20: 0.8 mg via SUBLINGUAL

## 2021-03-20 MED ORDER — IOHEXOL 350 MG/ML SOLN
95.0000 mL | Freq: Once | INTRAVENOUS | Status: AC | PRN
  Administered 2021-03-20: 95 mL via INTRAVENOUS

## 2021-03-29 DIAGNOSIS — H35373 Puckering of macula, bilateral: Secondary | ICD-10-CM | POA: Diagnosis not present

## 2021-03-29 DIAGNOSIS — H31093 Other chorioretinal scars, bilateral: Secondary | ICD-10-CM | POA: Diagnosis not present

## 2021-03-29 DIAGNOSIS — H3581 Retinal edema: Secondary | ICD-10-CM | POA: Diagnosis not present

## 2021-05-14 DIAGNOSIS — H52223 Regular astigmatism, bilateral: Secondary | ICD-10-CM | POA: Diagnosis not present

## 2021-07-18 DIAGNOSIS — H02831 Dermatochalasis of right upper eyelid: Secondary | ICD-10-CM | POA: Diagnosis not present

## 2021-07-18 DIAGNOSIS — Z961 Presence of intraocular lens: Secondary | ICD-10-CM | POA: Diagnosis not present

## 2021-07-18 DIAGNOSIS — H18413 Arcus senilis, bilateral: Secondary | ICD-10-CM | POA: Diagnosis not present

## 2021-07-18 DIAGNOSIS — H26491 Other secondary cataract, right eye: Secondary | ICD-10-CM | POA: Diagnosis not present

## 2021-09-26 DIAGNOSIS — B028 Zoster with other complications: Secondary | ICD-10-CM | POA: Diagnosis not present

## 2021-09-26 DIAGNOSIS — L03818 Cellulitis of other sites: Secondary | ICD-10-CM | POA: Diagnosis not present

## 2021-11-20 DIAGNOSIS — E559 Vitamin D deficiency, unspecified: Secondary | ICD-10-CM | POA: Diagnosis not present

## 2021-11-20 DIAGNOSIS — Z0000 Encounter for general adult medical examination without abnormal findings: Secondary | ICD-10-CM | POA: Diagnosis not present

## 2021-11-20 DIAGNOSIS — Z125 Encounter for screening for malignant neoplasm of prostate: Secondary | ICD-10-CM | POA: Diagnosis not present

## 2021-11-20 DIAGNOSIS — Z13220 Encounter for screening for lipoid disorders: Secondary | ICD-10-CM | POA: Diagnosis not present

## 2022-01-16 DIAGNOSIS — H31093 Other chorioretinal scars, bilateral: Secondary | ICD-10-CM | POA: Diagnosis not present

## 2022-01-16 DIAGNOSIS — H35373 Puckering of macula, bilateral: Secondary | ICD-10-CM | POA: Diagnosis not present

## 2022-01-16 DIAGNOSIS — H3581 Retinal edema: Secondary | ICD-10-CM | POA: Diagnosis not present

## 2022-02-26 DIAGNOSIS — Z125 Encounter for screening for malignant neoplasm of prostate: Secondary | ICD-10-CM | POA: Diagnosis not present

## 2022-02-28 DIAGNOSIS — Z09 Encounter for follow-up examination after completed treatment for conditions other than malignant neoplasm: Secondary | ICD-10-CM | POA: Diagnosis not present

## 2022-02-28 DIAGNOSIS — K5730 Diverticulosis of large intestine without perforation or abscess without bleeding: Secondary | ICD-10-CM | POA: Diagnosis not present

## 2022-02-28 DIAGNOSIS — Z86010 Personal history of colonic polyps: Secondary | ICD-10-CM | POA: Diagnosis not present

## 2022-02-28 DIAGNOSIS — D123 Benign neoplasm of transverse colon: Secondary | ICD-10-CM | POA: Diagnosis not present

## 2022-03-13 DIAGNOSIS — L570 Actinic keratosis: Secondary | ICD-10-CM | POA: Diagnosis not present

## 2022-03-13 DIAGNOSIS — L814 Other melanin hyperpigmentation: Secondary | ICD-10-CM | POA: Diagnosis not present

## 2022-03-13 DIAGNOSIS — C44529 Squamous cell carcinoma of skin of other part of trunk: Secondary | ICD-10-CM | POA: Diagnosis not present

## 2022-03-13 DIAGNOSIS — L821 Other seborrheic keratosis: Secondary | ICD-10-CM | POA: Diagnosis not present

## 2022-06-23 DIAGNOSIS — H52223 Regular astigmatism, bilateral: Secondary | ICD-10-CM | POA: Diagnosis not present

## 2022-07-02 DIAGNOSIS — R972 Elevated prostate specific antigen [PSA]: Secondary | ICD-10-CM | POA: Diagnosis not present

## 2022-07-23 IMAGING — CT CT HEART MORP W/ CTA COR W/ SCORE W/ CA W/CM &/OR W/O CM
4 of 7 series · 8 of 20 positions shown, 9 images · IV contrast (APPLIED)
Comparison: Chest CTA 11/25/2003.
COMPARISON: Chest CTA 11/25/2003.

Addendum:
EXAM:
OVER-READ INTERPRETATION  CT CHEST

The following report is an over-read performed by radiologist Dr.
Mineyuki Moinet [REDACTED] on 03/20/2021. This
over-read does not include interpretation of cardiac or coronary
anatomy or pathology. The coronary calcium score/coronary CTA
interpretation by the cardiologist is attached.
CLINICAL DATA: Chest pain
Cardiac CTA
MEDICATIONS:
Sub lingual nitro. 4mg x 2
TECHNIQUE: The patient was scanned on a Siemens [REDACTED]ice scanner. Gantry
rotation speed was 250 msecs. Collimation was 0.6 mm. A 100 kV
prospective scan was triggered in the ascending thoracic aorta at
35-75% of the R-R interval. Average HR during the scan was 60 bpm.
The 3D data set was interpreted on a dedicated work station using
MPR, MIP and VRT modes. A total of 80cc of contrast was used.

[Series 6: best diast · axial · 0.39mm/px · z∈[+1195,+1235]mm · 2 of 301 slices shown, 3 images]
[im 101/301  vessel]
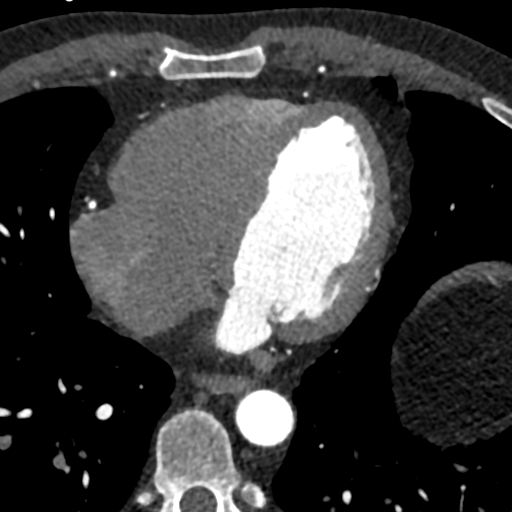
[im 101/301  lung]
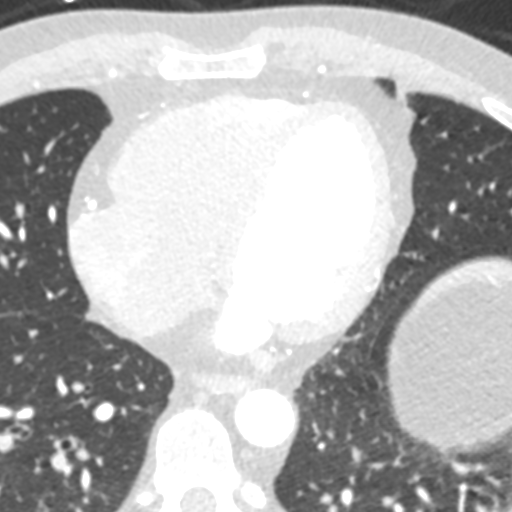
[im 201/301  vessel]
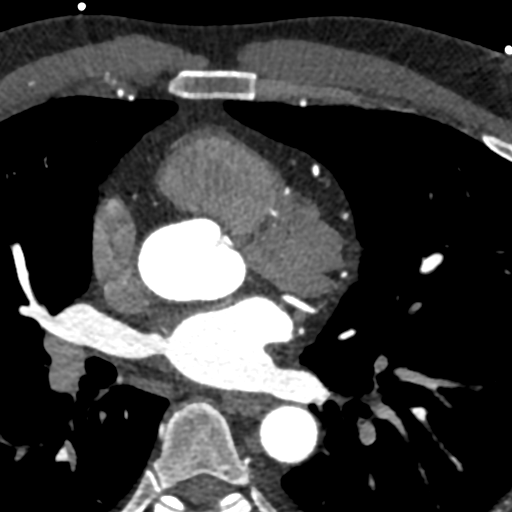

[Series 7: best syst · axial · 0.39mm/px · z∈[+1195,+1235]mm · 2 of 301 slices shown]
[im 101/301  vessel]
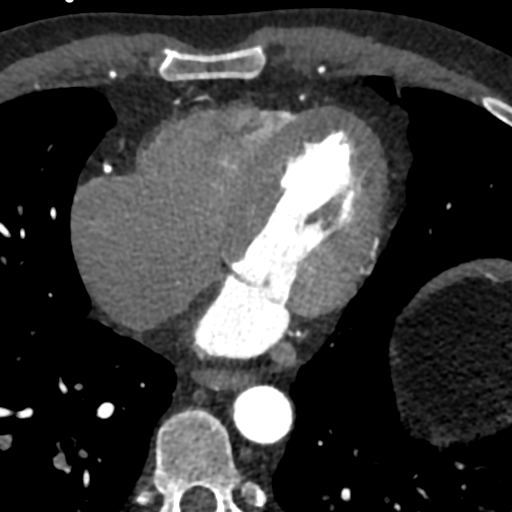
[im 201/301  vessel]
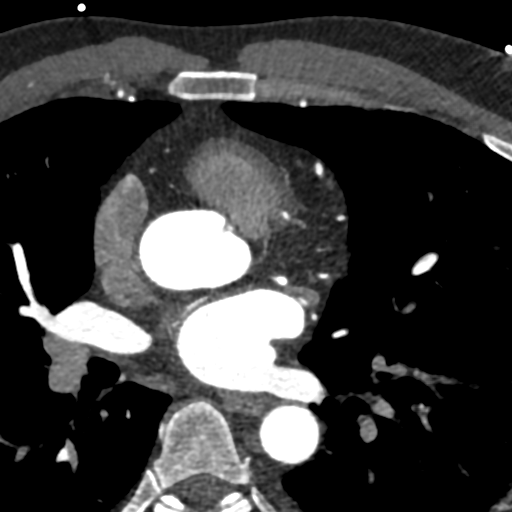

[Series 8: ts diast sharp · axial · 0.39mm/px · z∈[+1195,+1235]mm · 2 of 301 slices shown]
[im 101/301  lung]
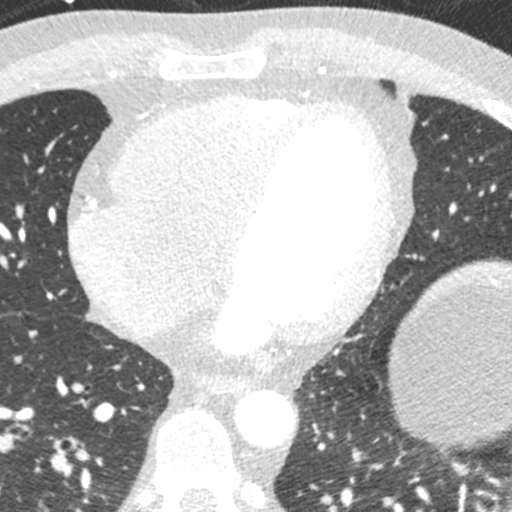
[im 201/301  lung]
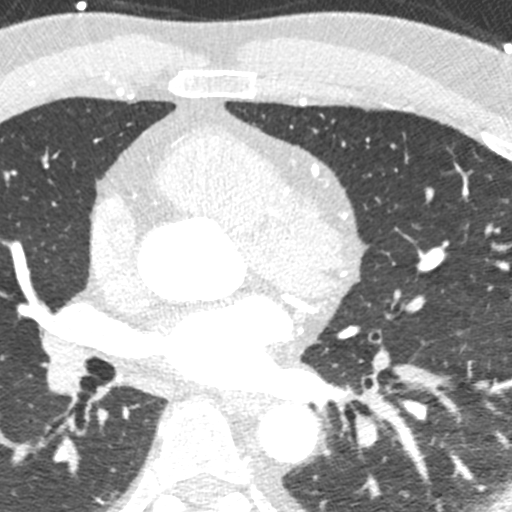

[Series 9: ts syst sharp · axial · 0.39mm/px · z∈[+1195,+1235]mm · 2 of 301 slices shown]
[im 101/301  lung]
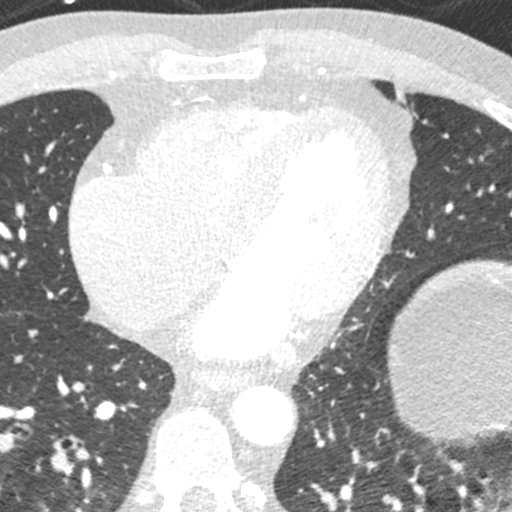
[im 201/301  lung]
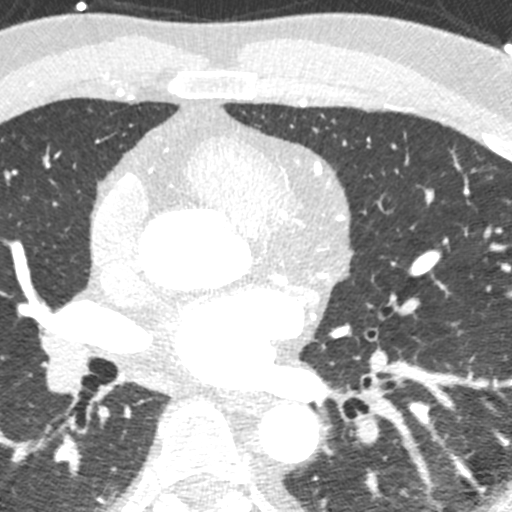

[8 of 20 positions shown; findings below may reference images not displayed]

FINDINGS: Within the visualized portions of the thorax there are no suspicious
appearing pulmonary nodules or masses, there is no acute
consolidative airspace disease, no pleural effusions, no
pneumothorax and no lymphadenopathy. Visualized portions of the
upper abdomen are unremarkable. There are no aggressive appearing
lytic or blastic lesions noted in the visualized portions of the
skeleton.
IMPRESSION: No significant incidental noncardiac findings are noted.
FINDINGS: Non-cardiac: See separate report from [REDACTED].

Pulmonary veins drain normally to the left atrium. No LA appendage
thrombus.

Calcium Score: 0 Agatston units.

Coronary Arteries: Right dominant with no anomalies

LM: No plaque or stenosis.

LAD system: No plaque or stenosis.

Circumflex system: No plaque or stenosis.

RCA system: No plaque or stenosis.
IMPRESSION: 1. Coronary calcium score 0 Agatston units, suggesting low risk for
future cardiac events.

2.  No significant coronary disease.

Eventon Ubisi

*** End of Addendum ***
EXAM:
OVER-READ INTERPRETATION  CT CHEST

The following report is an over-read performed by radiologist Dr.
Mineyuki Moinet [REDACTED] on 03/20/2021. This
over-read does not include interpretation of cardiac or coronary
anatomy or pathology. The coronary calcium score/coronary CTA
interpretation by the cardiologist is attached.
FINDINGS: Within the visualized portions of the thorax there are no suspicious
appearing pulmonary nodules or masses, there is no acute
consolidative airspace disease, no pleural effusions, no
pneumothorax and no lymphadenopathy. Visualized portions of the
upper abdomen are unremarkable. There are no aggressive appearing
lytic or blastic lesions noted in the visualized portions of the
skeleton.
IMPRESSION: No significant incidental noncardiac findings are noted.

## 2022-07-31 DIAGNOSIS — H18413 Arcus senilis, bilateral: Secondary | ICD-10-CM | POA: Diagnosis not present

## 2022-07-31 DIAGNOSIS — H02831 Dermatochalasis of right upper eyelid: Secondary | ICD-10-CM | POA: Diagnosis not present

## 2022-07-31 DIAGNOSIS — Z961 Presence of intraocular lens: Secondary | ICD-10-CM | POA: Diagnosis not present

## 2022-07-31 DIAGNOSIS — H26492 Other secondary cataract, left eye: Secondary | ICD-10-CM | POA: Diagnosis not present

## 2022-08-09 DIAGNOSIS — H26493 Other secondary cataract, bilateral: Secondary | ICD-10-CM | POA: Diagnosis not present

## 2022-11-22 DIAGNOSIS — Z113 Encounter for screening for infections with a predominantly sexual mode of transmission: Secondary | ICD-10-CM | POA: Diagnosis not present

## 2022-11-22 DIAGNOSIS — Z1329 Encounter for screening for other suspected endocrine disorder: Secondary | ICD-10-CM | POA: Diagnosis not present

## 2022-11-22 DIAGNOSIS — Z0000 Encounter for general adult medical examination without abnormal findings: Secondary | ICD-10-CM | POA: Diagnosis not present

## 2022-11-22 DIAGNOSIS — Z13220 Encounter for screening for lipoid disorders: Secondary | ICD-10-CM | POA: Diagnosis not present

## 2022-11-22 DIAGNOSIS — Z13228 Encounter for screening for other metabolic disorders: Secondary | ICD-10-CM | POA: Diagnosis not present

## 2022-11-22 DIAGNOSIS — R972 Elevated prostate specific antigen [PSA]: Secondary | ICD-10-CM | POA: Diagnosis not present

## 2023-02-12 DIAGNOSIS — R972 Elevated prostate specific antigen [PSA]: Secondary | ICD-10-CM | POA: Diagnosis not present

## 2023-02-12 DIAGNOSIS — N400 Enlarged prostate without lower urinary tract symptoms: Secondary | ICD-10-CM | POA: Diagnosis not present

## 2023-02-19 DIAGNOSIS — H35373 Puckering of macula, bilateral: Secondary | ICD-10-CM | POA: Diagnosis not present

## 2023-02-19 DIAGNOSIS — H59813 Chorioretinal scars after surgery for detachment, bilateral: Secondary | ICD-10-CM | POA: Diagnosis not present

## 2023-07-26 DIAGNOSIS — L821 Other seborrheic keratosis: Secondary | ICD-10-CM | POA: Diagnosis not present

## 2023-07-26 DIAGNOSIS — L814 Other melanin hyperpigmentation: Secondary | ICD-10-CM | POA: Diagnosis not present

## 2023-07-26 DIAGNOSIS — Z85828 Personal history of other malignant neoplasm of skin: Secondary | ICD-10-CM | POA: Diagnosis not present

## 2023-07-26 DIAGNOSIS — D0462 Carcinoma in situ of skin of left upper limb, including shoulder: Secondary | ICD-10-CM | POA: Diagnosis not present

## 2023-07-26 DIAGNOSIS — D0461 Carcinoma in situ of skin of right upper limb, including shoulder: Secondary | ICD-10-CM | POA: Diagnosis not present

## 2023-07-26 DIAGNOSIS — L570 Actinic keratosis: Secondary | ICD-10-CM | POA: Diagnosis not present

## 2023-07-26 DIAGNOSIS — D0471 Carcinoma in situ of skin of right lower limb, including hip: Secondary | ICD-10-CM | POA: Diagnosis not present

## 2023-12-09 DIAGNOSIS — M7711 Lateral epicondylitis, right elbow: Secondary | ICD-10-CM | POA: Diagnosis not present

## 2023-12-09 DIAGNOSIS — M7712 Lateral epicondylitis, left elbow: Secondary | ICD-10-CM | POA: Diagnosis not present

## 2023-12-09 DIAGNOSIS — Z0000 Encounter for general adult medical examination without abnormal findings: Secondary | ICD-10-CM | POA: Diagnosis not present

## 2024-02-03 DIAGNOSIS — L821 Other seborrheic keratosis: Secondary | ICD-10-CM | POA: Diagnosis not present

## 2024-02-03 DIAGNOSIS — D485 Neoplasm of uncertain behavior of skin: Secondary | ICD-10-CM | POA: Diagnosis not present

## 2024-02-03 DIAGNOSIS — D2371 Other benign neoplasm of skin of right lower limb, including hip: Secondary | ICD-10-CM | POA: Diagnosis not present

## 2024-02-03 DIAGNOSIS — B078 Other viral warts: Secondary | ICD-10-CM | POA: Diagnosis not present

## 2024-02-03 DIAGNOSIS — L570 Actinic keratosis: Secondary | ICD-10-CM | POA: Diagnosis not present

## 2024-02-03 DIAGNOSIS — Z85828 Personal history of other malignant neoplasm of skin: Secondary | ICD-10-CM | POA: Diagnosis not present

## 2024-02-03 DIAGNOSIS — L814 Other melanin hyperpigmentation: Secondary | ICD-10-CM | POA: Diagnosis not present

## 2024-02-03 DIAGNOSIS — D225 Melanocytic nevi of trunk: Secondary | ICD-10-CM | POA: Diagnosis not present

## 2024-02-18 DIAGNOSIS — H59813 Chorioretinal scars after surgery for detachment, bilateral: Secondary | ICD-10-CM | POA: Diagnosis not present

## 2024-02-18 DIAGNOSIS — H35373 Puckering of macula, bilateral: Secondary | ICD-10-CM | POA: Diagnosis not present

## 2024-03-31 DIAGNOSIS — M25521 Pain in right elbow: Secondary | ICD-10-CM | POA: Diagnosis not present

## 2024-03-31 DIAGNOSIS — M25522 Pain in left elbow: Secondary | ICD-10-CM | POA: Diagnosis not present

## 2024-03-31 DIAGNOSIS — R5382 Chronic fatigue, unspecified: Secondary | ICD-10-CM | POA: Diagnosis not present

## 2024-03-31 DIAGNOSIS — M2550 Pain in unspecified joint: Secondary | ICD-10-CM | POA: Diagnosis not present

## 2024-04-06 DIAGNOSIS — G4730 Sleep apnea, unspecified: Secondary | ICD-10-CM | POA: Diagnosis not present
